# Patient Record
Sex: Male | Born: 1955 | Hispanic: No | Marital: Married | Smoking: Never smoker
Health system: Southern US, Community
[De-identification: ages and names within clinical notes are randomized; demographics above are authoritative.]

## PROBLEM LIST (undated history)

## (undated) DIAGNOSIS — I209 Angina pectoris, unspecified: Secondary | ICD-10-CM

## (undated) DIAGNOSIS — I10 Essential (primary) hypertension: Secondary | ICD-10-CM

## (undated) DIAGNOSIS — Z8709 Personal history of other diseases of the respiratory system: Secondary | ICD-10-CM

## (undated) DIAGNOSIS — E7800 Pure hypercholesterolemia, unspecified: Secondary | ICD-10-CM

## (undated) HISTORY — PX: NO PAST SURGERIES: SHX2092

---

## 2005-02-07 ENCOUNTER — Encounter: Admission: RE | Admit: 2005-02-07 | Discharge: 2005-02-07 | Payer: Self-pay | Admitting: Internal Medicine

## 2011-08-21 ENCOUNTER — Encounter (HOSPITAL_COMMUNITY): Payer: Self-pay | Admitting: General Practice

## 2011-08-21 ENCOUNTER — Observation Stay (HOSPITAL_COMMUNITY)
Admission: AD | Admit: 2011-08-21 | Discharge: 2011-08-22 | Disposition: A | Discharge status: Home / Self Care | Payer: Managed Care, Other (non HMO) | Source: Ambulatory Visit | Attending: Cardiology | Admitting: Cardiology

## 2011-08-21 DIAGNOSIS — E785 Hyperlipidemia, unspecified: Secondary | ICD-10-CM | POA: Insufficient documentation

## 2011-08-21 DIAGNOSIS — R9431 Abnormal electrocardiogram [ECG] [EKG]: Secondary | ICD-10-CM | POA: Diagnosis present

## 2011-08-21 DIAGNOSIS — R079 Chest pain, unspecified: Principal | ICD-10-CM | POA: Insufficient documentation

## 2011-08-21 DIAGNOSIS — I200 Unstable angina: Secondary | ICD-10-CM | POA: Diagnosis present

## 2011-08-21 DIAGNOSIS — E7800 Pure hypercholesterolemia, unspecified: Secondary | ICD-10-CM | POA: Diagnosis present

## 2011-08-21 DIAGNOSIS — I10 Essential (primary) hypertension: Secondary | ICD-10-CM | POA: Diagnosis present

## 2011-08-21 HISTORY — DX: Angina pectoris, unspecified: I20.9

## 2011-08-21 HISTORY — DX: Pure hypercholesterolemia, unspecified: E78.00

## 2011-08-21 HISTORY — DX: Essential (primary) hypertension: I10

## 2011-08-21 HISTORY — DX: Personal history of other diseases of the respiratory system: Z87.09

## 2011-08-21 LAB — CARDIAC PANEL(CRET KIN+CKTOT+MB+TROPI)
CK, MB: 2.8 ng/mL (ref 0.3–4.0)
Relative Index: 1.5 (ref 0.0–2.5)
Total CK: 182 U/L (ref 7–232)
Troponin I: <0.3 ng/mL (ref <0.30)

## 2011-08-21 LAB — COMPREHENSIVE METABOLIC PANEL
ALT: 16 U/L (ref 0–53)
AST: 20 U/L (ref 0–37)
Albumin: 3.2 g/dL — ABNORMAL LOW (ref 3.5–5.2)
Alkaline Phosphatase: 71 U/L (ref 39–117)
BUN: 16 mg/dL (ref 6–23)
CO2: 28 mEq/L (ref 19–32)
Calcium: 9.2 mg/dL (ref 8.4–10.5)
Chloride: 106 mEq/L (ref 96–112)
Creatinine, Ser: 1.4 mg/dL — ABNORMAL HIGH (ref 0.50–1.35)
GFR calc Af Amer: 65 mL/min — ABNORMAL LOW (ref >90)
GFR calc non Af Amer: 56 mL/min — ABNORMAL LOW (ref >90)
Glucose, Bld: 88 mg/dL (ref 70–99)
Potassium: 4.1 mEq/L (ref 3.5–5.1)
Sodium: 142 mEq/L (ref 135–145)
Total Bilirubin: 0.3 mg/dL (ref 0.3–1.2)
Total Protein: 7 g/dL (ref 6.0–8.3)

## 2011-08-21 LAB — CBC
HCT: 44.2 % (ref 39.0–52.0)
Hemoglobin: 15 g/dL (ref 13.0–17.0)
MCH: 29.7 pg (ref 26.0–34.0)
MCHC: 33.9 g/dL (ref 30.0–36.0)
MCV: 87.5 fL (ref 78.0–100.0)
Platelets: 214 10*3/uL (ref 150–400)
RBC: 5.05 MIL/uL (ref 4.22–5.81)
RDW: 13.6 % (ref 11.5–15.5)
WBC: 7.3 10*3/uL (ref 4.0–10.5)

## 2011-08-21 LAB — PROTIME-INR: Prothrombin Time: 14.9 seconds (ref 11.6–15.2)

## 2011-08-21 MED ORDER — ENOXAPARIN SODIUM 100 MG/ML ~~LOC~~ SOLN
95.0000 mg | Freq: Two times a day (BID) | SUBCUTANEOUS | Status: DC
  Administered 2011-08-21 – 2011-08-22 (×2): 95 mg via SUBCUTANEOUS
  Filled 2011-08-21 (×4): qty 1

## 2011-08-21 MED ORDER — ASPIRIN 81 MG PO CHEW
324.0000 mg | CHEWABLE_TABLET | ORAL | Status: DC
  Filled 2011-08-21: qty 4

## 2011-08-21 MED ORDER — ASPIRIN EC 81 MG PO TBEC: 81.0000 mg | DELAYED_RELEASE_TABLET | Freq: Every day | ORAL | Status: DC

## 2011-08-21 MED ORDER — SODIUM CHLORIDE 0.9 % IV SOLN
INTRAVENOUS | Status: DC
  Administered 2011-08-21 – 2011-08-22 (×2): via INTRAVENOUS

## 2011-08-21 MED ORDER — NITROGLYCERIN 0.4 MG SL SUBL: 0.4000 mg | SUBLINGUAL_TABLET | SUBLINGUAL | Status: DC | PRN

## 2011-08-21 MED ORDER — SODIUM CHLORIDE 0.9 % IJ SOLN
3.0000 mL | Freq: Two times a day (BID) | INTRAMUSCULAR | Status: DC
  Administered 2011-08-21 – 2011-08-22 (×2): 3 mL via INTRAVENOUS

## 2011-08-21 MED ORDER — ONDANSETRON HCL 4 MG/2ML IJ SOLN: 4.0000 mg | Freq: Four times a day (QID) | INTRAMUSCULAR | Status: DC | PRN

## 2011-08-21 MED ORDER — ASPIRIN 300 MG RE SUPP
300.0000 mg | RECTAL | Status: DC
  Filled 2011-08-21: qty 1

## 2011-08-21 MED ORDER — ROSUVASTATIN CALCIUM 20 MG PO TABS
20.0000 mg | ORAL_TABLET | Freq: Every day | ORAL | Status: DC
  Administered 2011-08-22: 20 mg via ORAL
  Filled 2011-08-21 (×2): qty 1

## 2011-08-21 MED ORDER — ASPIRIN 81 MG PO CHEW
324.0000 mg | CHEWABLE_TABLET | ORAL | Status: AC
  Administered 2011-08-22: 324 mg via ORAL

## 2011-08-21 MED ORDER — ACETAMINOPHEN 325 MG PO TABS: 650.0000 mg | ORAL_TABLET | ORAL | Status: DC | PRN

## 2011-08-21 MED ORDER — SODIUM CHLORIDE 0.9 % IJ SOLN: 3.0000 mL | INTRAMUSCULAR | Status: DC | PRN

## 2011-08-21 MED ORDER — CARVEDILOL 3.125 MG PO TABS
3.1250 mg | ORAL_TABLET | Freq: Two times a day (BID) | ORAL | Status: DC
  Administered 2011-08-21 – 2011-08-22 (×2): 3.125 mg via ORAL
  Filled 2011-08-21 (×4): qty 1

## 2011-08-21 MED ORDER — SODIUM CHLORIDE 0.9 % IV SOLN: 250.0000 mL | INTRAVENOUS | Status: DC | PRN

## 2011-08-21 NOTE — H&P (Signed)
  Chest pain: 1 Week.  HOPI: Patient referred  for follow-up/evaluation and treatment of chest pain and abnormal EKG.    Chest pain started 1 week  ago. It is described as pressure. Chest pain is present with activity. Frequency is about 2-3  per day. Worsening  YES. It is located in the left side of chest region. It does radiate to the left neck. Chest discomfort lasts few minutes. Chest discomfort is associated with no dyspnea;  PND,  Orthopnea;  Diaphoresis;  Palpitations;  Nausea;  vomiting,  Dizziness or  near syncope.     ROS: Arthritis-no;  Cramping of the legs and feet at night or with activity-no; Diabetes-no;  Hypothyroidism-no;  Previous Stroke-no, Previous GI Bleed-no ,  Recent weight change-no . Erectile dysfunction ??, Symptoms to suggest sleep apnea like loud snoring, daytime sleepiness-snores at night, Other systems negative.   Past Medical History (Major events, hospitalizations, surgeries):  None.     Known allergies: NKDA.     Ongoing medical problems: Hyperlipidemia. Hypertension.    Family medical history: Mother is 80 yrs. old-Hyperlipidemia. Father is 85 yrs. old-Diabetes. 5 siblings-no heart related problems.    Preventative: No colonoscopy or endoscopy.    Social history: No smoking. Drinks alcohol 3 times a week. Married. 2 children.  Exam: Ht 70", Weight 208 Lbs, BP 140/100 mm Hg, HR 54/min. Regular.  GENERAL: Well built and overweight body habitus,  appears stated age, no acute distress. Alert Ox3.    EYES: PERRLA, EOM's full, conjunctivae clear.  THROAT: Clear, no exudates, no lesions.  NECK: Supple, no masses, no thyromegaly, no JVD.    CHEST: Lungs clear, no rales, no rhonchi, no wheezes.    HEART: S1S2 normal,  no murmurs, no rubs, no gallops.    ABDOMEN: Soft, no tenderness, no masses, BS normal.    BACK: Normal curvature, no tenderness.  EXTREMITIES: Full range of motion, no deformities, no edema, no erythema.     NEURO: Alert, oriented x 3,  no  localizing findings.    SKIN: Normal, no rashes, no lesions noted. No ulcerations or signs of limb ischemia. No xanthelasma or Xanthoma.    Vascular exam: Normal carotid upstroke. No bruit. Abdominal aortic palpation appears to be normal without prominent pulsation. No bruit. Femoral and pedal pulses are normal.  Plan:  Mechanism of underlying disease process and action of medications discussed with the patient.   I discussed primary/secondary prevention and also dietary counceling was done. Hospital admission: Patient will be admitted to the hospital for further evaluation. May need  cardiac catheterization in the morning unless ongoing chest pain then sooner on urgent basis.  Schedule for cardiac catheterization. We discussed regarding risks, benefits, alternatives to this including stress testing, CTA and continued medical therapy. Patient wants to proceed. Understands <1-2% risk of death, stroke, MI, urgent CABG, bleeding, infection, renal failure but not limited to these.  His son who is 65 years of age present at bedside.  REFERRALS:       Ralene Ok (Internal Medicine)  Fax: [phone]  Phone: [phone]

## 2011-08-21 NOTE — Progress Notes (Signed)
ANTICOAGULATION CONSULT NOTE - Initial Consult  Pharmacy Consult for lovenox Indication: chest pain/ACS  No Known Allergies  Patient Measurements:   Per MD note pt is 70" and 208#  Vital Signs:    Labs: No results found for this basename: HGB:2,HCT:3,PLT:3,APTT:3,LABPROT:3,INR:3,HEPARINUNFRC:3,CREATININE:3,CKTOTAL:3,CKMB:3,TROPONINI:3 in the last 72 hours CrCl is unknown because no creatinine reading has been taken and the patient has no height on file.  Medical History: No past medical history on file.  Medications:  Prescriptions prior to admission  Medication Sig Dispense Refill  . aspirin 81 MG chewable tablet Chew 81 mg by mouth daily.      Marland Kitchen lisinopril (PRINIVIL,ZESTRIL) 20 MG tablet Take 10 mg by mouth daily.       . pravastatin (PRAVACHOL) 80 MG tablet Take 10 mg by mouth 2 (two) times daily.         Assessment: 56 yo man admitted for w/u chest pain to start lovenox.  Cath in am Goal of Therapy:  Therapeutic anti-Xa levels   Plan:  Lovenox 95 mg sq q12 hours.  Check CBC q 3 days while on lovenox.  F/u renal function.  Ernest Mccoy 08/21/2011,5:51 PM

## 2011-08-22 ENCOUNTER — Other Ambulatory Visit: Payer: Self-pay

## 2011-08-22 ENCOUNTER — Ambulatory Visit (HOSPITAL_COMMUNITY): Admit: 2011-08-22 | Payer: Self-pay | Admitting: Cardiology

## 2011-08-22 ENCOUNTER — Encounter (HOSPITAL_COMMUNITY)
Admission: AD | Disposition: A | Discharge status: Home / Self Care | Payer: Self-pay | Source: Ambulatory Visit | Attending: Cardiology

## 2011-08-22 HISTORY — PX: LEFT HEART CATHETERIZATION WITH CORONARY ANGIOGRAM: SHX5451

## 2011-08-22 LAB — LIPID PANEL
Cholesterol: 192 mg/dL (ref 0–200)
Total CHOL/HDL Ratio: 5.1 RATIO
Triglycerides: 178 mg/dL — ABNORMAL HIGH (ref <150)
VLDL: 36 mg/dL (ref 0–40)

## 2011-08-22 LAB — CARDIAC PANEL(CRET KIN+CKTOT+MB+TROPI)
Relative Index: 1.7 (ref 0.0–2.5)
Total CK: 142 U/L (ref 7–232)

## 2011-08-22 SURGERY — LEFT HEART CATHETERIZATION WITH CORONARY ANGIOGRAM
Anesthesia: LOCAL

## 2011-08-22 MED ORDER — PRAVASTATIN SODIUM 10 MG PO TABS: 10.0000 mg | ORAL_TABLET | Freq: Every day | ORAL | Status: DC

## 2011-08-22 MED ORDER — MIDAZOLAM HCL 2 MG/2ML IJ SOLN
INTRAMUSCULAR | Status: AC
  Filled 2011-08-22: qty 2

## 2011-08-22 MED ORDER — SODIUM CHLORIDE 0.9 % IV SOLN: INTRAVENOUS | Status: DC

## 2011-08-22 MED ORDER — LISINOPRIL 10 MG PO TABS: 10.0000 mg | ORAL_TABLET | Freq: Every day | ORAL | Status: DC

## 2011-08-22 MED ORDER — ONDANSETRON HCL 4 MG/2ML IJ SOLN: 4.0000 mg | Freq: Four times a day (QID) | INTRAMUSCULAR | Status: DC | PRN

## 2011-08-22 MED ORDER — SODIUM CHLORIDE 0.9 % IV SOLN
INTRAVENOUS | Status: DC
  Administered 2011-08-22: 09:00:00 via INTRAVENOUS

## 2011-08-22 MED ORDER — ASPIRIN 81 MG PO CHEW: 81.0000 mg | CHEWABLE_TABLET | Freq: Every day | ORAL | Status: DC

## 2011-08-22 MED ORDER — ACETAMINOPHEN 325 MG PO TABS: 650.0000 mg | ORAL_TABLET | ORAL | Status: DC | PRN

## 2011-08-22 MED ORDER — HYDROMORPHONE HCL PF 2 MG/ML IJ SOLN
INTRAMUSCULAR | Status: AC
  Filled 2011-08-22: qty 1

## 2011-08-22 NOTE — Cardiovascular Report (Signed)
Ernest Mccoy, Ernest Mccoy NO.:  [id number]  MEDICAL RECORD NO.:  [id number]  LOCATION:  [facility]                         FACILITY:  [facility]  PHYSICIAN:  Ernest Pert, MD DATE OF BIRTH:  Oct 26, 1955  DATE OF PROCEDURE: DATE OF DISCHARGE:                           CARDIAC CATHETERIZATION   PROCEDURE PERFORMED:  Left heart catheterization including: 1. Left ventriculography. 2. Selective right and left coronary arteriography.  INDICATION:  [name] is a pleasant 56 year old gentleman with history of hypertension, hyperlipidemia who has been having chest pain for the last 1 week.  He had new EKG abnormalities in the anterior leads, suggestive of ischemia.  He is now brought to the cardiac catheterization lab to evaluate his coronary anatomy.  He was admitted as unstable angina to the hospital because of rest pain.  HEMODYNAMIC DATA:  The left ventricular pressure was 100/4 with end- diastolic pressure of 14 mmHg.  Aortic pressure was 109/83 with a mean of 95 mmHg.  There was no pressure gradient across the aortic valve.  ANGIOGRAPHIC DATA:  Left ventricle.  Left ventricular systolic function was normal with ejection fraction of 60%.  There was no significant mitral regurgitation.  Right coronary artery.  Right coronary artery is a small vessel and is a dominant vessel.  It is smooth and normal.  Left main coronary artery.  Left main coronary artery is a large-caliber vessel.  It is smooth and normal.  Circumflex coronary artery.  Circumflex coronary artery is a very large caliber-vessel giving origin to a large obtuse marginal 1, obtuse marginal 2, and a small obtuse marginal 3 and continues as a PDA branch. It is smooth and normal.  LAD.  LAD is a large-caliber vessel giving origin to large diagonal 1 and a moderate-sized diagonal 2.  It is smooth and normal.  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  Ascending aortogram.  Ascending  aortogram was performed to evaluate for ascending aortic aneurysm and dissection given his chest pain.  This revealed normal ascending aorta with the presence of 3 aortic valve cusps without any dissection.  There was no aortic regurgitation.  CONCLUSION: 1. Normal coronary arteries.  Left dominant circulation. 2. Normal left ventricle systolic function. 3. Ascending aorta is normal without evidence of aortic dissection or     aortic regurgitation.  RECOMMENDATION:  Evaluation for a noncardiac cause of chest pain is indicated.  The patient will be discharged home today with outpatient followup.  We will continue with his home medications with no change for now.  Consideration for a PPI should be given.  TECHNICAL PROCEDURE:  Under sterile precaution using a 6-French right radial arterial access, a 5-French TIG #4 catheter was advanced into the ascending aorta, then left main coronary artery was selectively engaged, and angiography was performed.  The catheter was then pulled out of the body and a 5-French Judkins right diagnostic catheter was utilized initially and then a 5-French multipurpose B2 catheter to engage the right coronary artery, and angiography was performed.  Left ventriculography was performed using the multipurpose B2 catheter in the RAO projection.  Catheter was pulled into the ascending aorta, and catheter exchange was done  over an exchange length safety J-wire.  A 5- French pigtail was advanced into the ascending aorta, and ascending aortogram was performed in the LAO projection.  The patient tolerated the procedure.  TIG catheter was exchanged again and was pulled out of the body, exchange was done over a J-wire.  Hemostasis was obtained by applying TR band.     Ernest Pert, MD     JRG/MEDQ  D:  08/22/2011  T:  08/22/2011  Job:  [id number]  cc:   Ernest Mccoy, M.D.

## 2011-08-22 NOTE — Discharge Summary (Signed)
Physician Discharge Summary  Patient ID: Ernest Mccoy MRN: [id number] DOB/AGE: [birth year] 56 y.o.  Admit date: 08/21/2011 Discharge date: 08/22/2011  Primary Discharge Diagnosis Chest pain probably GERD. Secondary Discharge DiagnosisHypertension, hyperlipidemia.  Significant Diagnostic Studies: Left heart catheterization showing normal coronary arteries, left dominant circulation. No aortic dissection.  Consults: None  Hospital Course: Admitted last evening. Was r/o for MI and underwent heart cath the following day and found to have normal coronary arteries. Being discharged with op f/u   Discharge Exam: Blood pressure 156/98, pulse 70, temperature 97.7 F (36.5 C), temperature source Oral, resp. rate 18, height 5\' 10"  (1.778 m), weight 86.592 kg (190 lb 14.4 oz), SpO2 100.00%.   General appearance: alert, cooperative and appears stated age Eyes: conjunctivae/corneas clear. PERRL, EOM's intact. Fundi benign. Neck: no adenopathy, no carotid bruit, no JVD, supple, symmetrical, trachea midline and thyroid not enlarged, symmetric, no tenderness/mass/nodules Resp: clear to auscultation bilaterally Cardio: regular rate and rhythm, S1, S2 normal, no murmur, click, rub or gallop GI: soft, non-tender; bowel sounds normal; no masses,  no organomegaly Extremities: extremities normal, atraumatic, no cyanosis or edema Labs:   Lab Results  Component Value Date   WBC 7.3 08/21/2011   HGB 15.0 08/21/2011   HCT 44.2 08/21/2011   MCV 87.5 08/21/2011   PLT 214 08/21/2011    Lab 08/21/11 1829  NA 142  K 4.1  CL 106  CO2 28  BUN 16  CREATININE 1.40*  CALCIUM 9.2  PROT 7.0  BILITOT 0.3  ALKPHOS 71  ALT 16  AST 20  GLUCOSE 88   Lab Results  Component Value Date   CKTOTAL 134 08/22/2011   CKMB 2.3 08/22/2011   TROPONINI <0.30 08/22/2011    Lab Results  Component Value Date   CHOL 192 08/21/2011   Lab Results  Component Value Date   HDL 38* 08/21/2011   Lab Results  Component Value Date    LDLCALC 118* 08/21/2011   Lab Results  Component Value Date   TRIG 178* 08/21/2011   Lab Results  Component Value Date   CHOLHDL 5.1 08/21/2011   No results found for this basename: LDLDIRECT      Radiology: No results found.  EKG:S. Bradycardia. Inferior and lateral TWI cannot r/o ischemia. No change from previous.   FOLLOW UP PLANS AND APPOINTMENTS: Dr. Ralene Ok in 2 weeks.   Medication List  As of 08/22/2011  8:46 AM   Continue taking these medications         lisinopril 10 MG tablet once daily      pravastatin 10 MG tablet twice daily         TAKE these medications         aspirin 81 MG chewable tablet   Chew 81 mg by mouth daily.              Pamella Pert, MD 08/22/2011, 8:46 AM

## 2011-08-22 NOTE — H&P (View-Only) (Signed)
  Chest pain: 1 Week.  HOPI: Patient referred  for follow-up/evaluation and treatment of chest pain and abnormal EKG.    Chest pain started 1 week  ago. It is described as pressure. Chest pain is present with activity. Frequency is about 2-3  per day. Worsening  YES. It is located in the left side of chest region. It does radiate to the left neck. Chest discomfort lasts few minutes. Chest discomfort is associated with no dyspnea;  PND,  Orthopnea;  Diaphoresis;  Palpitations;  Nausea;  vomiting,  Dizziness or  near syncope.     ROS: Arthritis-no;  Cramping of the legs and feet at night or with activity-no; Diabetes-no;  Hypothyroidism-no;  Previous Stroke-no, Previous GI Bleed-no ,  Recent weight change-no . Erectile dysfunction ??, Symptoms to suggest sleep apnea like loud snoring, daytime sleepiness-snores at night, Other systems negative.   Past Medical History (Major events, hospitalizations, surgeries):  None.     Known allergies: NKDA.     Ongoing medical problems: Hyperlipidemia. Hypertension.    Family medical history: Mother is 80 yrs. old-Hyperlipidemia. Father is 81 yrs. old-Diabetes. 5 siblings-no heart related problems.    Preventative: No colonoscopy or endoscopy.    Social history: No smoking. Drinks alcohol 3 times a week. Married. 2 children.  Exam: Ht 70", Weight 208 Lbs, BP 140/100 mm Hg, HR 54/min. Regular.  GENERAL: Well built and overweight body habitus,  appears stated age, no acute distress. Alert Ox3.    EYES: PERRLA, EOM's full, conjunctivae clear.  THROAT: Clear, no exudates, no lesions.  NECK: Supple, no masses, no thyromegaly, no JVD.    CHEST: Lungs clear, no rales, no rhonchi, no wheezes.    HEART: S1S2 normal,  no murmurs, no rubs, no gallops.    ABDOMEN: Soft, no tenderness, no masses, BS normal.    BACK: Normal curvature, no tenderness.  EXTREMITIES: Full range of motion, no deformities, no edema, no erythema.     NEURO: Alert, oriented x 3,  no  localizing findings.    SKIN: Normal, no rashes, no lesions noted. No ulcerations or signs of limb ischemia. No xanthelasma or Xanthoma.    Vascular exam: Normal carotid upstroke. No bruit. Abdominal aortic palpation appears to be normal without prominent pulsation. No bruit. Femoral and pedal pulses are normal.  Plan:  Mechanism of underlying disease process and action of medications discussed with the patient.   I discussed primary/secondary prevention and also dietary counceling was done. Hospital admission: Patient will be admitted to the hospital for further evaluation. May need  cardiac catheterization in the morning unless ongoing chest pain then sooner on urgent basis.  Schedule for cardiac catheterization. We discussed regarding risks, benefits, alternatives to this including stress testing, CTA and continued medical therapy. Patient wants to proceed. Understands <1-2% risk of death, stroke, MI, urgent CABG, bleeding, infection, renal failure but not limited to these.  His son who is 21 years of age present at bedside.  REFERRALS:       Roy Moreira (Internal Medicine)  Fax: [phone]  Phone: [phone] 

## 2011-08-22 NOTE — Brief Op Note (Signed)
08/21/2011 - 08/22/2011  8:39 AM  PATIENT:  Ernest Mccoy  56 y.o. male  PRE-OPERATIVE DIAGNOSIS:  chest pain  POST-OPERATIVE DIAGNOSIS: Normal coronary arteries  PROCEDURE:  Procedure(s): LEFT HEART CATHETERIZATION WITH CORONARY ANGIOGRAM  SURGEON:  Surgeon(s): Pamella Pert, MD  DICTATION: .Other Dictation: Dictation Number [id number]

## 2011-08-22 NOTE — Interval H&P Note (Signed)
History and Physical Interval Note:  08/22/2011 7:26 AM  Ernest Mccoy  has presented today for surgery, with the diagnosis of chest pain  The various methods of treatment have been discussed with the patient and family. After consideration of risks, benefits and other options for treatment, the patient has consented to  Procedure(s): LEFT HEART CATHETERIZATION WITH CORONARY ANGIOGRAM as a surgical intervention .  The patients' history has been reviewed, patient examined, no change in status, stable for surgery.  I have reviewed the patients' chart and labs.  Questions were answered to the patient's satisfaction.     Letesha Klecker,JAGADEESH R  No change in HPI. Labs within normal limits.

## 2011-08-22 NOTE — Progress Notes (Signed)
Patient's heart rate noted at 49 while sleeping. RN woke patient and heart rate increased to 60.

## 2014-07-07 ENCOUNTER — Encounter (HOSPITAL_COMMUNITY): Payer: Self-pay | Admitting: Cardiology

## 2015-06-21 ENCOUNTER — Encounter (HOSPITAL_COMMUNITY): Payer: Self-pay | Admitting: Emergency Medicine

## 2015-06-21 ENCOUNTER — Emergency Department (HOSPITAL_COMMUNITY)
Admission: EM | Admit: 2015-06-21 | Discharge: 2015-06-22 | Disposition: A | Discharge status: Home / Self Care | Payer: Commercial Managed Care - PPO | Attending: Emergency Medicine | Admitting: Emergency Medicine

## 2015-06-21 ENCOUNTER — Emergency Department (HOSPITAL_COMMUNITY): Payer: Commercial Managed Care - PPO

## 2015-06-21 DIAGNOSIS — R079 Chest pain, unspecified: Secondary | ICD-10-CM | POA: Diagnosis not present

## 2015-06-21 DIAGNOSIS — Z8709 Personal history of other diseases of the respiratory system: Secondary | ICD-10-CM | POA: Insufficient documentation

## 2015-06-21 DIAGNOSIS — I10 Essential (primary) hypertension: Secondary | ICD-10-CM | POA: Insufficient documentation

## 2015-06-21 DIAGNOSIS — R0789 Other chest pain: Secondary | ICD-10-CM

## 2015-06-21 DIAGNOSIS — Z8639 Personal history of other endocrine, nutritional and metabolic disease: Secondary | ICD-10-CM | POA: Diagnosis not present

## 2015-06-21 DIAGNOSIS — I209 Angina pectoris, unspecified: Secondary | ICD-10-CM | POA: Insufficient documentation

## 2015-06-21 LAB — BASIC METABOLIC PANEL
Anion gap: 7 (ref 5–15)
BUN: 16 mg/dL (ref 6–20)
CO2: 29 mmol/L (ref 22–32)
Calcium: 9.1 mg/dL (ref 8.9–10.3)
Chloride: 103 mmol/L (ref 101–111)
Creatinine, Ser: 1.39 mg/dL — ABNORMAL HIGH (ref 0.61–1.24)
GFR calc Af Amer: >60 mL/min (ref >60)
GFR, EST NON AFRICAN AMERICAN: 55 mL/min — AB (ref >60)
GLUCOSE: 118 mg/dL — AB (ref 65–99)
POTASSIUM: 4.5 mmol/L (ref 3.5–5.1)
Sodium: 139 mmol/L (ref 135–145)

## 2015-06-21 LAB — I-STAT TROPONIN, ED: Troponin i, poc: 0.02 ng/mL (ref 0.00–0.08)

## 2015-06-21 LAB — CBC
HEMATOCRIT: 47.1 % (ref 39.0–52.0)
Hemoglobin: 15.8 g/dL (ref 13.0–17.0)
MCH: 30.9 pg (ref 26.0–34.0)
MCHC: 33.5 g/dL (ref 30.0–36.0)
MCV: 92 fL (ref 78.0–100.0)
Platelets: 189 10*3/uL (ref 150–400)
RBC: 5.12 MIL/uL (ref 4.22–5.81)
RDW: 13.5 % (ref 11.5–15.5)
WBC: 7.9 10*3/uL (ref 4.0–10.5)

## 2015-06-21 NOTE — ED Notes (Signed)
Dr. Nanavati at the bedside.  

## 2015-06-21 NOTE — ED Notes (Signed)
Pt reports chest tightness x 1 day with increased bp. Denies other sx.

## 2015-06-21 NOTE — ED Provider Notes (Signed)
CSN: [id number]     Arrival date & time 06/21/15  2248 History  By signing my name below, I, Freida Busman, attest that this documentation has been prepared under the direction and in the presence of Derwood Kaplan, MD . Electronically Signed: Freida Busman, Scribe. 06/22/2015. 12:02 AM.   Chief Complaint  Patient presents with  . Chest Pain   The history is provided by the patient. No language interpreter was used.    HPI Comments:  Ernest Mccoy is a 59 y.o. male with a history of angina, HLD and HTN, who presents to the Emergency Department complaining of intermittent, sharp, non-radiating, left sided CP that began ~ 1 week ago. The episodes last ~ 10 minutes and resolve on its own. He denies exacerbating factors. Pt notes  similar episode ~ 3 years ago; he was evaluated and told he had a cardiac blockage, states that episode was worse. He denies nausea, SOB, and diaphoresis. No alleviating factors noted. Pt denies smoking, family h/o MI/cardiac issues; no h/o blood clots . His pain at this time is ~ 3/10.  No PCP at the moment but has insurance.  Past Medical History  Diagnosis Date  . Angina   . High cholesterol   . Hypertension   . History of bronchitis     "when i was younger"   Past Surgical History  Procedure Laterality Date  . No past surgeries    . Left heart catheterization with coronary angiogram N/A 08/22/2011    Procedure: LEFT HEART CATHETERIZATION WITH CORONARY ANGIOGRAM;  Surgeon: Pamella Pert, MD;  Location: [facility] CATH LAB;  Service: Cardiovascular;  Laterality: N/A;   No family history on file. Social History  Substance Use Topics  . Smoking status: Never Smoker   . Smokeless tobacco: Never Used  . Alcohol Use: 8.4 oz/week    14 Cans of beer per week    Review of Systems  10 systems reviewed and all are negative for acute change except as noted in the HPI.   Allergies  Review of patient's allergies indicates no known allergies.  Home Medications    Prior to Admission medications   Medication Sig Start Date End Date Taking? Authorizing Provider  lisinopril (PRINIVIL) 10 MG tablet Take 1 tablet (10 mg total) by mouth daily. 08/22/11 06/22/15 Yes Yates Decamp, MD  aspirin 81 MG chewable tablet Chew 1 tablet (81 mg total) by mouth daily. 06/22/15   Lahari Suttles, MD   BP 109/91 mmHg  Pulse 55  Temp(Src) 98.2 F (36.8 C) (Oral)  Resp 17  Ht  (1.753 m)  Wt 210 lb (95.255 kg)  BMI 31.00 kg/m2  SpO2 96% Physical Exam  Constitutional: He is oriented to person, place, and time. He appears well-developed and well-nourished. No distress.  HENT:  Head: Normocephalic and atraumatic.  Eyes: Conjunctivae are normal.  Cardiovascular: Normal rate and intact distal pulses.   Pulmonary/Chest: Effort normal and breath sounds normal. No respiratory distress. He exhibits no tenderness.  Lungs CTA bilaterally  Abdominal: He exhibits no distension.  Musculoskeletal: He exhibits no edema.  No palpable mass No pitting edema, calf tenderness, unilateral swelling  Neurological: He is alert and oriented to person, place, and time.  Skin: Skin is warm and dry.  Psychiatric: He has a normal mood and affect.  Nursing note and vitals reviewed.   ED Course  Procedures   DIAGNOSTIC STUDIES:  Oxygen Saturation is 98% on RA, normal by my interpretation.    COORDINATION OF CARE:  11:22 PM Will order bloodwork, CXR, EKG and nitro. Discussed treatment plan with pt at bedside and pt agreed to plan.  Labs Review Labs Reviewed  BASIC METABOLIC PANEL - Abnormal; Notable for the following:    Glucose, Bld 118 (*)    Creatinine, Ser 1.39 (*)    GFR calc non Af Amer 55 (*)    All other components within normal limits  CBC  I-STAT TROPOININ, ED  Rosezena SensorI-STAT TROPOININ, ED    Imaging Review Dg Chest 2 View  06/21/2015  CLINICAL DATA:  Acute onset of generalized chest tightness and pain in both arms. Initial encounter. EXAM: CHEST  2 VIEW COMPARISON:   None. FINDINGS: The lungs are well-aerated and clear. There is no evidence of focal opacification, pleural effusion or pneumothorax. The heart is normal in size; the mediastinal contour is within normal limits. No acute osseous abnormalities are seen. IMPRESSION: No acute cardiopulmonary process seen. Electronically Signed   By: Roanna RaiderJeffery  Chang M.D.   On: 06/21/2015 23:18   I have personally reviewed and evaluated these images and lab results as part of my medical decision-making.   EKG Interpretation   Date/Time:  Wednesday June 21 2015 22:53:11 EST Ventricular Rate:  74 PR Interval:  164 QRS Duration: 98 QT Interval:  394 QTC Calculation: 437 R Axis:   68 Text Interpretation:  Normal sinus rhythm Normal ECG No acute changes  Confirmed by Vail Vuncannon, MD, Janey GentaANKIT [id number]) on 06/21/2015 11:11:59 PM      @3  am: pt is chest pain free and the trops x 2 are neg, will d/c.   MDM   Final diagnoses:  Chest pain of uncertain etiology    I personally performed the services described in this documentation, which was scribed in my presence. The recorded information has been reviewed and is accurate.   Pt comes in with cc of chest pain. Very atypical chest pain - intermittent, now more persistent L sided sharp pain. Pain is very focal with no concerning associated symptoms. Pain is not exertional. HEAR score is 163 (age and risk factors).  Pt had a CATH in 2013 - and had clean coronaries at that time as per below report below:  "CONCLUSION: 1. Normal coronary arteries. Left dominant circulation. 2. Normal left ventricle systolic function. 3. Ascending aorta is normal without evidence of aortic dissection or  aortic regurgitation."  Plan is to get trops x 2 and if neg d/c with f/u with Dr. Jacinto HalimGanji.   Derwood KaplanAnkit Texas Souter, MD 06/22/15 1900

## 2015-06-22 LAB — I-STAT TROPONIN, ED: TROPONIN I, POC: 0 ng/mL (ref 0.00–0.08)

## 2015-06-22 MED ORDER — ASPIRIN 81 MG PO CHEW: 81.0000 mg | CHEWABLE_TABLET | Freq: Every day | ORAL | Status: DC

## 2015-06-22 MED ORDER — ASPIRIN 81 MG PO CHEW
324.0000 mg | CHEWABLE_TABLET | Freq: Once | ORAL | Status: AC
  Administered 2015-06-22: 324 mg via ORAL
  Filled 2015-06-22: qty 4

## 2015-06-22 NOTE — ED Notes (Signed)
Lab at the bedside 

## 2015-06-22 NOTE — Discharge Instructions (Signed)
We saw you in the ER for the chest pain/shortness of breath. °All of our cardiac workup is normal, including labs, EKG and chest X-RAY are normal. °We are not sure what is causing your discomfort, but we feel comfortable sending you home at this time. The workup in the ER is not complete, and you should follow up with your primary care doctor for further evaluation. ° °Please return to the ER if you have worsening chest pain, shortness of breath, pain radiating to your jaw, shoulder, or back, sweats or fainting. Otherwise see the Cardiologist or your primary care doctor as requested. ° ° °Nonspecific Chest Pain  °Chest pain can be caused by many different conditions. There is always a chance that your pain could be related to something serious, such as a heart attack or a blood clot in your lungs. Chest pain can also be caused by conditions that are not life-threatening. If you have chest pain, it is very important to follow up with your health care provider. °CAUSES  °Chest pain can be caused by: °· Heartburn. °· Pneumonia or bronchitis. °· Anxiety or stress. °· Inflammation around your heart (pericarditis) or lung (pleuritis or pleurisy). °· A blood clot in your lung. °· A collapsed lung (pneumothorax). It can develop suddenly on its own (spontaneous pneumothorax) or from trauma to the chest. °· Shingles infection (varicella-zoster virus). °· Heart attack. °· Damage to the bones, muscles, and cartilage that make up your chest wall. This can include: °¨ Bruised bones due to injury. °¨ Strained muscles or cartilage due to frequent or repeated coughing or overwork. °¨ Fracture to one or more ribs. °¨ Sore cartilage due to inflammation (costochondritis). °RISK FACTORS  °Risk factors for chest pain may include: °· Activities that increase your risk for trauma or injury to your chest. °· Respiratory infections or conditions that cause frequent coughing. °· Medical conditions or overeating that can cause  heartburn. °· Heart disease or family history of heart disease. °· Conditions or health behaviors that increase your risk of developing a blood clot. °· Having had chicken pox (varicella zoster). °SIGNS AND SYMPTOMS °Chest pain can feel like: °· Burning or tingling on the surface of your chest or deep in your chest. °· Crushing, pressure, aching, or squeezing pain. °· Dull or sharp pain that is worse when you move, cough, or take a deep breath. °· Pain that is also felt in your back, neck, shoulder, or arm, or pain that spreads to any of these areas. °Your chest pain may come and go, or it may stay constant. °DIAGNOSIS °Lab tests or other studies may be needed to find the cause of your pain. Your health care provider may have you take a test called an ambulatory ECG (electrocardiogram). An ECG records your heartbeat patterns at the time the test is performed. You may also have other tests, such as: °· Transthoracic echocardiogram (TTE). During echocardiography, sound waves are used to create a picture of all of the heart structures and to look at how blood flows through your heart. °· Transesophageal echocardiogram (TEE). This is a more advanced imaging test that obtains images from inside your body. It allows your health care provider to see your heart in finer detail. °· Cardiac monitoring. This allows your health care provider to monitor your heart rate and rhythm in real time. °· Holter monitor. This is a portable device that records your heartbeat and can help to diagnose abnormal heartbeats. It allows your health care provider to track your   heart activity for several days, if needed. °· Stress tests. These can be done through exercise or by taking medicine that makes your heart beat more quickly. °· Blood tests. °· Imaging tests. °TREATMENT  °Your treatment depends on what is causing your chest pain. Treatment may include: °· Medicines. These may include: °¨ Acid blockers for heartburn. °¨ Anti-inflammatory  medicine. °¨ Pain medicine for inflammatory conditions. °¨ Antibiotic medicine, if an infection is present. °¨ Medicines to dissolve blood clots. °¨ Medicines to treat coronary artery disease. °· Supportive care for conditions that do not require medicines. This may include: °¨ Resting. °¨ Applying heat or cold packs to injured areas. °¨ Limiting activities until pain decreases. °HOME CARE INSTRUCTIONS °· If you were prescribed an antibiotic medicine, finish it all even if you start to feel better. °· Avoid any activities that bring on chest pain. °· Do not use any tobacco products, including cigarettes, chewing tobacco, or electronic cigarettes. If you need help quitting, ask your health care provider. °· Do not drink alcohol. °· Take medicines only as directed by your health care provider. °· Keep all follow-up visits as directed by your health care provider. This is important. This includes any further testing if your chest pain does not go away. °· If heartburn is the cause for your chest pain, you may be told to keep your head raised (elevated) while sleeping. This reduces the chance that acid will go from your stomach into your esophagus. °· Make lifestyle changes as directed by your health care provider. These may include: °¨ Getting regular exercise. Ask your health care provider to suggest some activities that are safe for you. °¨ Eating a heart-healthy diet. A registered dietitian can help you to learn healthy eating options. °¨ Maintaining a healthy weight. °¨ Managing diabetes, if necessary. °¨ Reducing stress. °SEEK MEDICAL CARE IF: °· Your chest pain does not go away after treatment. °· You have a rash with blisters on your chest. °· You have a fever. °SEEK IMMEDIATE MEDICAL CARE IF:  °· Your chest pain is worse. °· You have an increasing cough, or you cough up blood. °· You have severe abdominal pain. °· You have severe weakness. °· You faint. °· You have chills. °· You have sudden, unexplained chest  discomfort. °· You have sudden, unexplained discomfort in your arms, back, neck, or jaw. °· You have shortness of breath at any time. °· You suddenly start to sweat, or your skin gets clammy. °· You feel nauseous or you vomit. °· You suddenly feel light-headed or dizzy. °· Your heart begins to beat quickly, or it feels like it is skipping beats. °These symptoms may represent a serious problem that is an emergency. Do not wait to see if the symptoms will go away. Get medical help right away. Call your local emergency services (911 in the [place] Do not drive yourself to the hospital. °  °This information is not intended to replace advice given to you by your health care provider. Make sure you discuss any questions you have with your health care provider. °  °Document Released: 04/24/2005 Document Revised: 08/05/2014 Document Reviewed: 02/18/2014 °Elsevier Interactive Patient Education ©2016 [employer] ° °

## 2016-07-19 IMAGING — CR DG CHEST 2V
2 series · 2 of 2 positions shown · non-contrast
Comparison: None.

CLINICAL DATA: Acute onset of generalized chest tightness and pain
in both arms. Initial encounter.

EXAM:
CHEST  2 VIEW

[chest pa]
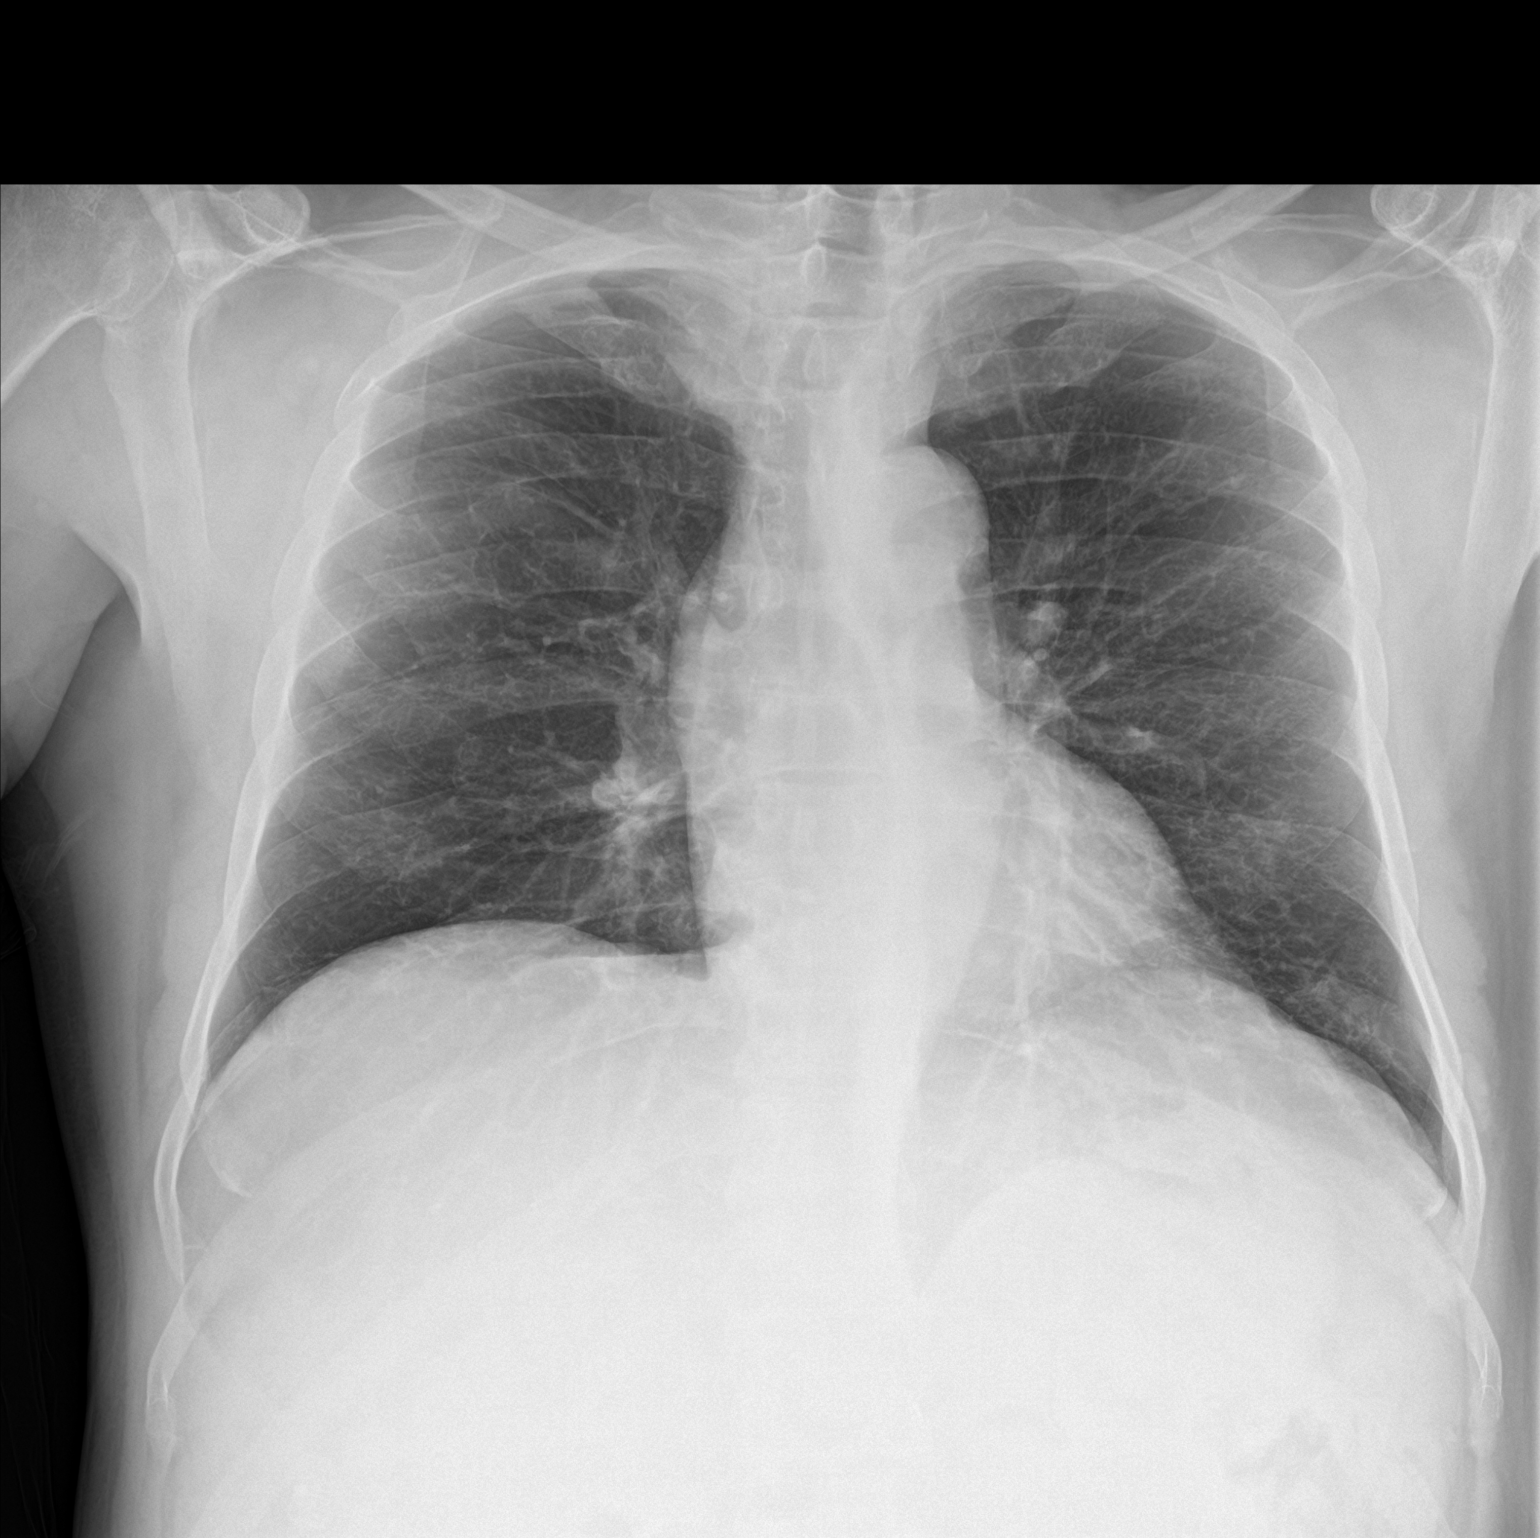

[chest lat]
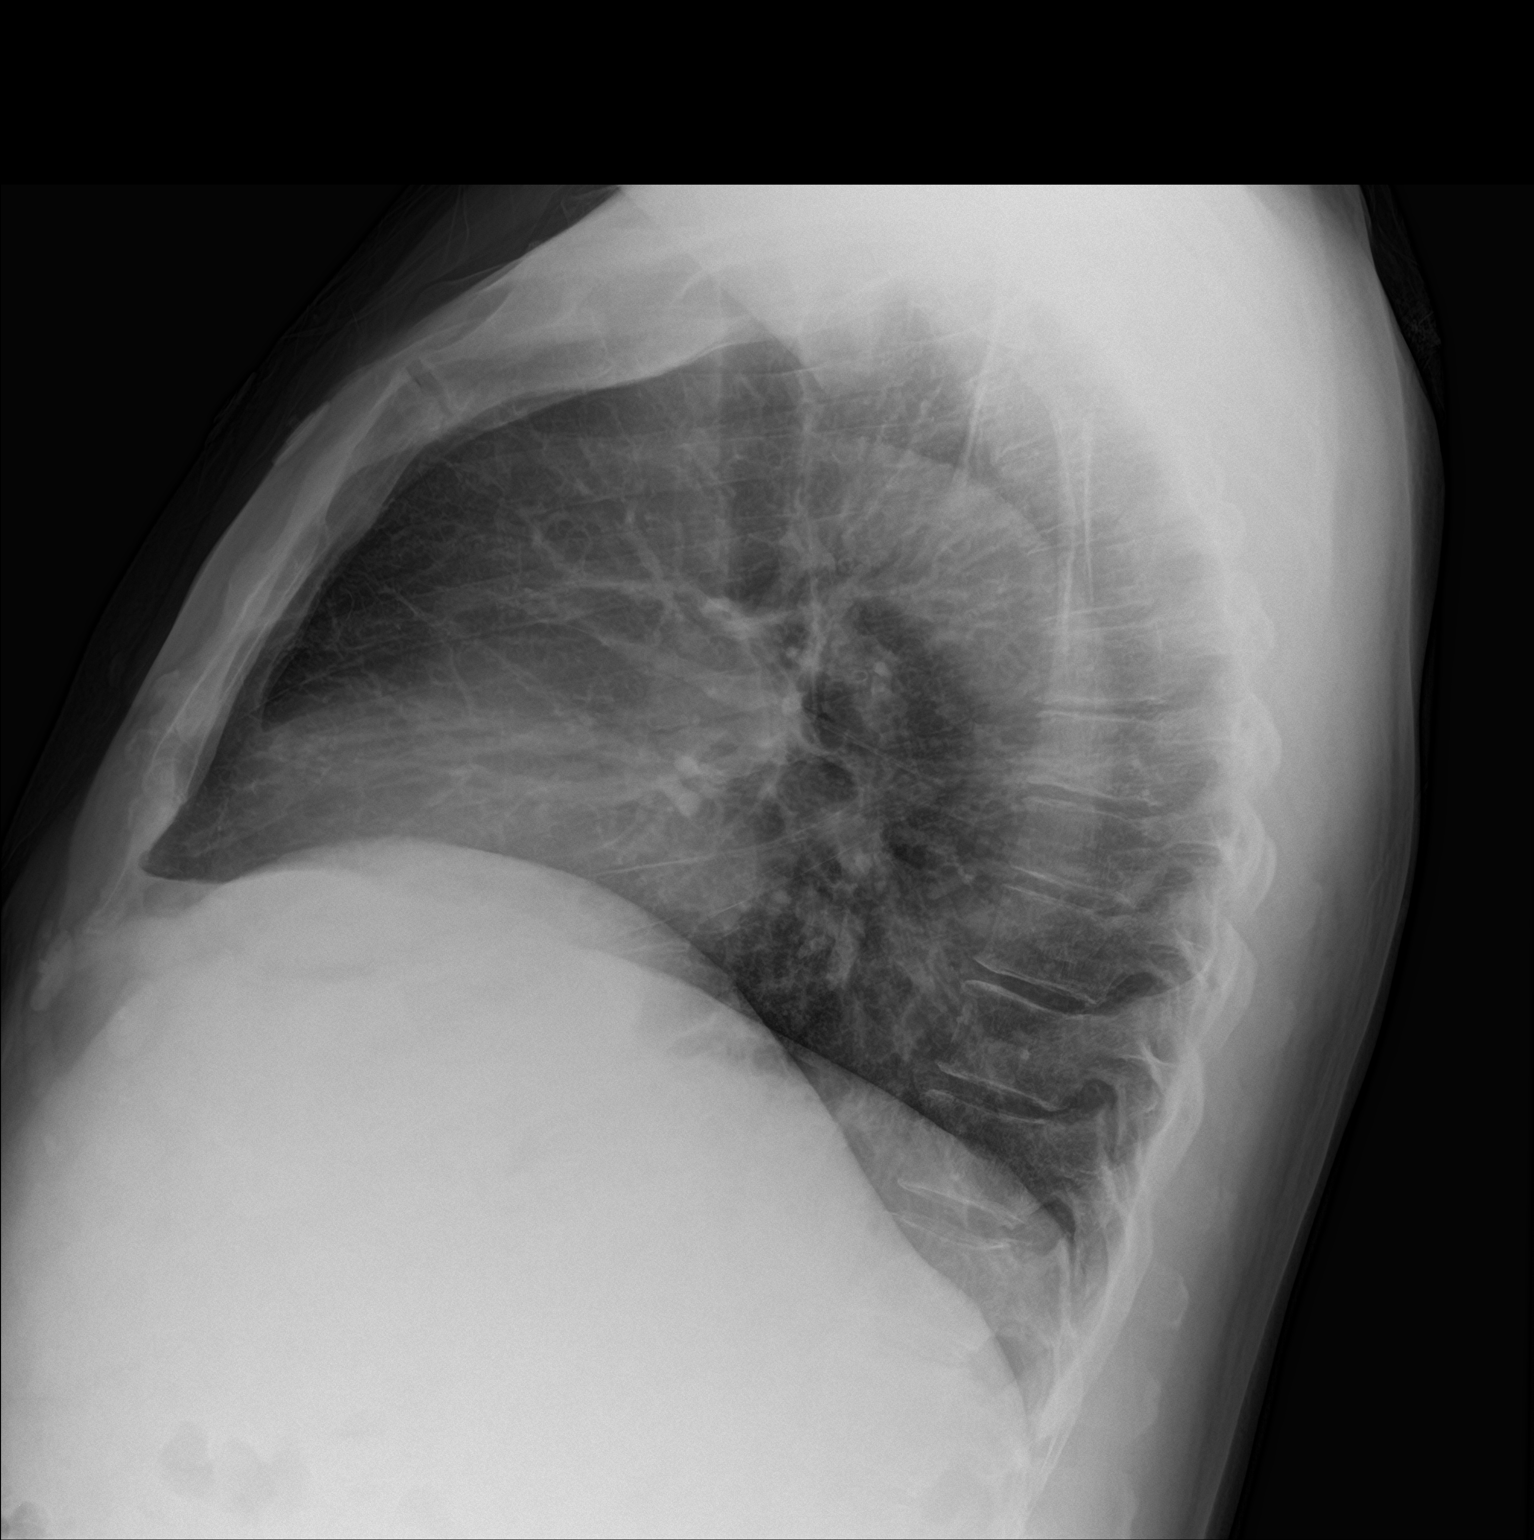

[2 of 2 positions shown; findings below may reference images not displayed]

FINDINGS: The lungs are well-aerated and clear. There is no evidence of focal
opacification, pleural effusion or pneumothorax.

The heart is normal in size; the mediastinal contour is within
normal limits. No acute osseous abnormalities are seen.
IMPRESSION: No acute cardiopulmonary process seen.

## 2020-10-20 ENCOUNTER — Encounter: Payer: Self-pay | Admitting: Cardiology

## 2020-10-20 ENCOUNTER — Other Ambulatory Visit: Payer: Self-pay

## 2020-10-20 ENCOUNTER — Ambulatory Visit: Payer: Managed Care, Other (non HMO) | Admitting: Cardiology

## 2020-10-20 VITALS — BP 136/97 | HR 83 | Temp 97.8°F | Resp 16 | Ht 69.0 in | Wt 217.4 lb

## 2020-10-20 DIAGNOSIS — E782 Mixed hyperlipidemia: Secondary | ICD-10-CM

## 2020-10-20 DIAGNOSIS — I10 Essential (primary) hypertension: Secondary | ICD-10-CM

## 2020-10-20 DIAGNOSIS — R0989 Other specified symptoms and signs involving the circulatory and respiratory systems: Secondary | ICD-10-CM

## 2020-10-20 MED ORDER — ROSUVASTATIN CALCIUM 20 MG PO TABS: 20.0000 mg | ORAL_TABLET | Freq: Every day | ORAL | 3 refills | Status: DC

## 2020-10-20 MED ORDER — AMLODIPINE BESYLATE 5 MG PO TABS: 5.0000 mg | ORAL_TABLET | Freq: Every day | ORAL | 3 refills | Status: DC

## 2020-10-20 NOTE — Progress Notes (Signed)
Patient referred by Jilda Panda, MD for hypertension  Subjective:   Ernest Mccoy, male    DOB: 01/22/1956, 65 y.o.   MRN: [id number]   Chief Complaint  Patient presents with  . Hypertension  . New Patient (Initial Visit)     HPI  65 y.o. Hispanic male male from [place] with hypertension, mixed hyperlipidemia.  Medical assistant Lolita Patella Mares assisted with interpretation.  Patient is originally from [place], works Theatre manager job.  He stays active with regular activity on the job, as well as weight training workouts.  He has had some shoulder pain present at rest, unrelated to exertion.  Blood pressure is elevated.  He is currently on lisinopril 10 mg daily.   Past Medical History:  Diagnosis Date  . Angina   . High cholesterol   . History of bronchitis    "when i was younger"  . Hypertension      Past Surgical History:  Procedure Laterality Date  . LEFT HEART CATHETERIZATION WITH CORONARY ANGIOGRAM N/A 08/22/2011   Procedure: LEFT HEART CATHETERIZATION WITH CORONARY ANGIOGRAM;  Surgeon: Laverda Page, MD;  Location: [facility] CATH LAB;  Service: Cardiovascular;  Laterality: N/A;  . NO PAST SURGERIES       Social History   Tobacco Use  Smoking Status Never Smoker  Smokeless Tobacco Never Used    Social History   Substance and Sexual Activity  Alcohol Use Yes  . Alcohol/week: 14.0 standard drinks  . Types: 14 Cans of beer per week     Family History  Problem Relation Age of Onset  . Heart attack Mother   . Diabetes Father      Current Outpatient Medications on File Prior to Visit  Medication Sig Dispense Refill  . aspirin 81 MG chewable tablet Chew 1 tablet (81 mg total) by mouth daily. 30 tablet 0  . lisinopril (PRINIVIL) 10 MG tablet Take 1 tablet (10 mg total) by mouth daily.     No current facility-administered medications on file prior to visit.    Cardiovascular and other pertinent studies:  EKG 10/20/2020: Sinus rhythm 71 bpm RSR(V1)  -nondiagnostic Nonspecific T-abnormality Low voltage limb leads  Coronary angiography 08/22/2011: Normal coronary arteries, no CAD   Recent labs: 10/07/2020: Glucose 97, BUN/Cr 22/1.53. EGFR 55. Na/K 144/4.2. Rest of the CMP normal H/H 13/40. MCV 88. Platelets 191 HbA1C 6.0% Chol 214, TG 80, HDL 50, LDL 148 TSH N/A    Review of Systems  Cardiovascular: Negative for chest pain, dyspnea on exertion, leg swelling, palpitations and syncope.  Musculoskeletal:       Bilateral shoulder pain         Vitals:   10/20/20 0858 10/20/20 0905  BP: (!) 138/101 (!) 136/97  Pulse: 84 83  Resp: 16   Temp: 97.8 F (36.6 C)   SpO2: 98%      Body mass index is 32.1 kg/m. Filed Weights   10/20/20 0858  Weight: 217 lb 6.4 oz (98.6 kg)     Objective:   Physical Exam Vitals and nursing note reviewed.  Constitutional:      General: He is not in acute distress. Neck:     Vascular: No JVD.  Cardiovascular:     Rate and Rhythm: Normal rate and regular rhythm.     Pulses: Normal pulses.          Carotid pulses are on the left side with bruit.    Heart sounds: Normal heart sounds. No murmur heard.   Pulmonary:  Effort: Pulmonary effort is normal.     Breath sounds: Normal breath sounds. No wheezing or rales.  Musculoskeletal:     Right lower leg: No edema.     Left lower leg: No edema.          Assessment & Recommendations:   65 y.o. Hispanic male male from [place] with hypertension, mixed hyperlipidemia.  Hypertension: Uncontrolled.  Added amlodipine 5 mg daily.  Continue lisinopril 10 mg daily.  Mixed hyperlipidemia: Started Crestor 10 mg daily.  Carotid bruit: We will obtain carotid ultrasound.  Discussed heart healthy diet and lifestyle.  Okay to continue aspirin for now, pending further work-up on carotid bruit.  Further recommendations after above testing.  Thank you for referring the patient to [place]. Please feel free to contact with any  questions.   [name], MD Pager: [phone] Office: [phone]

## 2020-10-21 ENCOUNTER — Encounter: Payer: Self-pay | Admitting: Cardiology

## 2020-10-21 DIAGNOSIS — E782 Mixed hyperlipidemia: Secondary | ICD-10-CM | POA: Insufficient documentation

## 2020-10-21 DIAGNOSIS — R0989 Other specified symptoms and signs involving the circulatory and respiratory systems: Secondary | ICD-10-CM | POA: Insufficient documentation

## 2020-11-02 ENCOUNTER — Ambulatory Visit: Payer: Managed Care, Other (non HMO)

## 2020-11-02 ENCOUNTER — Other Ambulatory Visit: Payer: Self-pay

## 2020-11-02 DIAGNOSIS — R0989 Other specified symptoms and signs involving the circulatory and respiratory systems: Secondary | ICD-10-CM

## 2020-11-02 DIAGNOSIS — I10 Essential (primary) hypertension: Secondary | ICD-10-CM

## 2020-11-20 ENCOUNTER — Other Ambulatory Visit: Payer: Self-pay

## 2020-11-20 ENCOUNTER — Other Ambulatory Visit (HOSPITAL_COMMUNITY): Payer: Self-pay | Admitting: Cardiology

## 2020-11-20 DIAGNOSIS — E782 Mixed hyperlipidemia: Secondary | ICD-10-CM

## 2020-11-20 DIAGNOSIS — I10 Essential (primary) hypertension: Secondary | ICD-10-CM

## 2020-11-20 DIAGNOSIS — R0989 Other specified symptoms and signs involving the circulatory and respiratory systems: Secondary | ICD-10-CM

## 2020-11-21 LAB — COMPREHENSIVE METABOLIC PANEL
ALT: 10 IU/L (ref 0–44)
AST: 18 IU/L (ref 0–40)
Albumin/Globulin Ratio: 1.4 (ref 1.2–2.2)
Albumin: 4.1 g/dL (ref 3.8–4.8)
Alkaline Phosphatase: 77 IU/L (ref 44–121)
BUN/Creatinine Ratio: 15 (ref 10–24)
BUN: 22 mg/dL (ref 8–27)
Bilirubin Total: 0.3 mg/dL (ref 0.0–1.2)
CO2: 21 mmol/L (ref 20–29)
Calcium: 9.2 mg/dL (ref 8.6–10.2)
Chloride: 105 mmol/L (ref 96–106)
Creatinine, Ser: 1.42 mg/dL — ABNORMAL HIGH (ref 0.76–1.27)
Globulin, Total: 3 g/dL (ref 1.5–4.5)
Glucose: 89 mg/dL (ref 65–99)
Potassium: 5.1 mmol/L (ref 3.5–5.2)
Sodium: 141 mmol/L (ref 134–144)
Total Protein: 7.1 g/dL (ref 6.0–8.5)
eGFR: 56 mL/min/{1.73_m2} — ABNORMAL LOW (ref >59)

## 2020-11-21 LAB — CBC WITH DIFFERENTIAL/PLATELET
Basophils Absolute: 0.1 10*3/uL (ref 0.0–0.2)
Basos: 1 %
EOS (ABSOLUTE): 0.1 10*3/uL (ref 0.0–0.4)
Eos: 2 %
Hematocrit: 36.6 % — ABNORMAL LOW (ref 37.5–51.0)
Hemoglobin: 11.5 g/dL — ABNORMAL LOW (ref 13.0–17.7)
Immature Grans (Abs): 0 10*3/uL (ref 0.0–0.1)
Immature Granulocytes: 0 %
Lymphocytes Absolute: 1.2 10*3/uL (ref 0.7–3.1)
Lymphs: 20 %
MCH: 26.9 pg (ref 26.6–33.0)
MCHC: 31.4 g/dL — ABNORMAL LOW (ref 31.5–35.7)
MCV: 86 fL (ref 79–97)
Monocytes Absolute: 0.6 10*3/uL (ref 0.1–0.9)
Monocytes: 10 %
Neutrophils Absolute: 3.9 10*3/uL (ref 1.4–7.0)
Neutrophils: 67 %
Platelets: 224 10*3/uL (ref 150–450)
RBC: 4.28 x10E6/uL (ref 4.14–5.80)
RDW: 12.9 % (ref 11.6–15.4)
WBC: 5.8 10*3/uL (ref 3.4–10.8)

## 2020-11-21 LAB — MAGNESIUM: Magnesium: 2 mg/dL (ref 1.6–2.3)

## 2020-11-21 LAB — LIPID PANEL WITH LDL/HDL RATIO
Cholesterol, Total: 125 mg/dL (ref 100–199)
HDL: 54 mg/dL (ref >39)
LDL Chol Calc (NIH): 57 mg/dL (ref 0–99)
LDL/HDL Ratio: 1.1 ratio (ref 0.0–3.6)
Triglycerides: 67 mg/dL (ref 0–149)
VLDL Cholesterol Cal: 14 mg/dL (ref 5–40)

## 2020-12-06 ENCOUNTER — Other Ambulatory Visit: Payer: Self-pay

## 2020-12-06 ENCOUNTER — Ambulatory Visit: Payer: Managed Care, Other (non HMO) | Admitting: Cardiology

## 2020-12-06 ENCOUNTER — Encounter: Payer: Self-pay | Admitting: Cardiology

## 2020-12-06 VITALS — BP 118/82 | HR 74 | Resp 16 | Ht 69.0 in | Wt 215.0 lb

## 2020-12-06 DIAGNOSIS — I10 Essential (primary) hypertension: Secondary | ICD-10-CM

## 2020-12-06 DIAGNOSIS — E782 Mixed hyperlipidemia: Secondary | ICD-10-CM

## 2020-12-06 MED ORDER — AMLODIPINE BESYLATE 5 MG PO TABS: 5.0000 mg | ORAL_TABLET | Freq: Every day | ORAL | 0 refills | Status: DC

## 2020-12-06 MED ORDER — LISINOPRIL 10 MG PO TABS: 10.0000 mg | ORAL_TABLET | Freq: Every day | ORAL | 0 refills | Status: AC

## 2020-12-06 MED ORDER — ROSUVASTATIN CALCIUM 20 MG PO TABS: 20.0000 mg | ORAL_TABLET | Freq: Every day | ORAL | 0 refills | Status: DC

## 2020-12-06 NOTE — Progress Notes (Signed)
Patient referred by Ernest Panda, MD for hypertension  Subjective:   Ernest Mccoy, male    DOB: 12/25/1955, 65 y.o.   MRN: [id number]   Chief Complaint  Patient presents with  . Hypertension  . Follow-up  . Results     HPI  65 y.o. Hispanic male male from [place] with hypertension, mixed hyperlipidemia.  Medical assistant Ernest Mccoy assisted with interpretation.  He denies any complaints today. Blood pressure is well controlled. Reviewed recent test result with the patient, details below.     Current Outpatient Medications on File Prior to Visit  Medication Sig Dispense Refill  . amLODipine (NORVASC) 5 MG tablet Take 1 tablet (5 mg total) by mouth daily. 30 tablet 3  . aspirin 81 MG chewable tablet Chew 1 tablet (81 mg total) by mouth daily. 30 tablet 0  . lisinopril (PRINIVIL) 10 MG tablet Take 1 tablet (10 mg total) by mouth daily.    . rosuvastatin (CRESTOR) 20 MG tablet Take 1 tablet (20 mg total) by mouth daily. 30 tablet 3   No current facility-administered medications on file prior to visit.    Cardiovascular and other pertinent studies:  Carotid artery duplex 11/02/2020:  The bifurcation, internal, external and common carotid arteries reveal no  evidence of significant stenosis, bilaterally. There is very minimal soft  plaque noted bilaterally.  Antegrade right vertebral artery flow. Antegrade left vertebral artery  Flow.  Echocardiogram 11/02/2020:  Left ventricle cavity is normal in size. Moderate concentric hypertrophy  of the left ventricle. Normal global wall motion. Normal LV systolic  function with EF 55%. Doppler evidence of grade I (impaired) diastolic  dysfunction, normal LAP.  Left atrial cavity is moderately dilated.  Trileaflet aortic valve. Mild to moderate aortic regurgitation.  Mild (Grade I) mitral regurgitation.  Mild tricuspid regurgitation. Estimated pulmonary artery systolic pressure  31 mmHg.  Incidental findings of  echolucent structures in the liver, likely cysts.  EKG 10/20/2020: Sinus rhythm 71 bpm RSR(V1) -nondiagnostic Nonspecific T-abnormality Low voltage limb leads  Coronary angiography 08/22/2011: Normal coronary arteries, no CAD   Recent labs: 11/20/2020: Glucose 89, BUN/Cr 22/1.42. EGFR 56. Na/K 141/5.1. Rest of the CMP normal H/H 11.5/36.6. MCV 86. Platelets 224 Chol 125, TG 67, HDL 54, LDL 57  10/07/2020: Glucose 97, BUN/Cr 22/1.53. EGFR 55. Na/K 144/4.2. Rest of the CMP normal H/H 13/40. MCV 88. Platelets 191 HbA1C 6.0% Chol 214, TG 80, HDL 50, LDL 148 TSH N/A    Review of Systems  Cardiovascular: Negative for chest pain, dyspnea on exertion, leg swelling, palpitations and syncope.  Musculoskeletal:       Bilateral shoulder pain         Vitals:   12/06/20 1300  BP: 118/82  Pulse: 74  Resp: 16  SpO2: 97%     Body mass index is 31.75 kg/m. Filed Weights   12/06/20 1300  Weight: 215 lb (97.5 kg)     Objective:   Physical Exam Vitals and nursing note reviewed.  Constitutional:      General: He is not in acute distress. Neck:     Vascular: No JVD.  Cardiovascular:     Rate and Rhythm: Normal rate and regular rhythm.     Pulses: Normal pulses.          Carotid pulses are on the left side with bruit.    Heart sounds: Normal heart sounds. No murmur heard.   Pulmonary:     Effort: Pulmonary effort is normal.  Breath sounds: Normal breath sounds. No wheezing or rales.  Musculoskeletal:     Right lower leg: No edema.     Left lower leg: No edema.          Assessment & Recommendations:   65 y.o. Hispanic male male from [place] with hypertension, mixed hyperlipidemia.  Hypertension: Very well controlled.  Refilled amlodipine and lisinopril. Future refills could be obtained from PCP Dr. Mellody Mccoy.  Mixed hyperlipidemia: LDL down to 57 on Crestor 10 mg daily. Crestor refilled. Future refills could be obtained from PCP Dr. Mellody Mccoy.  Carotid  bruit: No carotid stenosis Ok to discontinue Aspirin.  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 Patient denies any complains related to this and states that his mother also had "cysts". Defer f/u to Dr. Mellody Mccoy.  I will see him on as needed basis.     Ernest Mormon, MD Pager: [phone] Office: [phone]

## 2020-12-15 ENCOUNTER — Other Ambulatory Visit: Payer: Self-pay | Admitting: Cardiology

## 2020-12-15 DIAGNOSIS — I10 Essential (primary) hypertension: Secondary | ICD-10-CM

## 2020-12-15 DIAGNOSIS — E782 Mixed hyperlipidemia: Secondary | ICD-10-CM

## 2021-02-13 ENCOUNTER — Other Ambulatory Visit: Payer: Self-pay | Admitting: Cardiology

## 2021-02-13 DIAGNOSIS — E782 Mixed hyperlipidemia: Secondary | ICD-10-CM

## 2021-02-13 DIAGNOSIS — I10 Essential (primary) hypertension: Secondary | ICD-10-CM

## 2021-03-15 ENCOUNTER — Other Ambulatory Visit: Payer: Self-pay | Admitting: Cardiology

## 2021-03-15 DIAGNOSIS — E782 Mixed hyperlipidemia: Secondary | ICD-10-CM

## 2021-03-15 DIAGNOSIS — I10 Essential (primary) hypertension: Secondary | ICD-10-CM

## 2021-07-13 ENCOUNTER — Other Ambulatory Visit: Payer: Self-pay | Admitting: Cardiology

## 2021-07-13 DIAGNOSIS — I10 Essential (primary) hypertension: Secondary | ICD-10-CM

## 2021-08-12 ENCOUNTER — Other Ambulatory Visit: Payer: Self-pay | Admitting: Cardiology

## 2021-08-12 DIAGNOSIS — I10 Essential (primary) hypertension: Secondary | ICD-10-CM

## 2021-09-11 ENCOUNTER — Other Ambulatory Visit: Payer: Self-pay | Admitting: Cardiology

## 2021-09-11 DIAGNOSIS — I10 Essential (primary) hypertension: Secondary | ICD-10-CM

## 2021-09-24 ENCOUNTER — Other Ambulatory Visit: Payer: Self-pay | Admitting: Cardiology

## 2021-09-24 DIAGNOSIS — I10 Essential (primary) hypertension: Secondary | ICD-10-CM

## 2021-09-24 DIAGNOSIS — E782 Mixed hyperlipidemia: Secondary | ICD-10-CM

## 2022-01-19 ENCOUNTER — Other Ambulatory Visit: Payer: Self-pay | Admitting: Cardiology

## 2022-01-19 DIAGNOSIS — I10 Essential (primary) hypertension: Secondary | ICD-10-CM

## 2022-02-14 ENCOUNTER — Other Ambulatory Visit: Payer: Self-pay | Admitting: Cardiology

## 2022-02-14 DIAGNOSIS — I10 Essential (primary) hypertension: Secondary | ICD-10-CM

## 2022-08-22 ENCOUNTER — Other Ambulatory Visit: Payer: Self-pay | Admitting: Nephrology

## 2022-08-22 DIAGNOSIS — N1832 Chronic kidney disease, stage 3b: Secondary | ICD-10-CM

## 2022-09-10 ENCOUNTER — Ambulatory Visit
Admission: RE | Admit: 2022-09-10 | Discharge: 2022-09-10 | Disposition: A | Discharge status: Home / Self Care | Payer: Commercial Managed Care - PPO | Source: Ambulatory Visit | Attending: Nephrology

## 2022-09-10 DIAGNOSIS — N1832 Chronic kidney disease, stage 3b: Secondary | ICD-10-CM
